# Patient Record
Sex: Female | Born: 1965 | Race: Black or African American | Hispanic: No | Marital: Single | Smoking: Current every day smoker
Health system: Southern US, Community
[De-identification: ages and names within clinical notes are randomized; demographics above are authoritative.]

## PROBLEM LIST (undated history)

## (undated) DIAGNOSIS — I10 Essential (primary) hypertension: Secondary | ICD-10-CM

## (undated) DIAGNOSIS — I639 Cerebral infarction, unspecified: Secondary | ICD-10-CM

---

## 2010-01-25 ENCOUNTER — Emergency Department (HOSPITAL_BASED_OUTPATIENT_CLINIC_OR_DEPARTMENT_OTHER)
Admission: EM | Admit: 2010-01-25 | Discharge: 2010-01-25 | Payer: Self-pay | Source: Home / Self Care | Admitting: Emergency Medicine

## 2010-04-22 LAB — BASIC METABOLIC PANEL
BUN: 12 mg/dL (ref 6–23)
Chloride: 109 mEq/L (ref 96–112)
Creatinine, Ser: 0.6 mg/dL (ref 0.4–1.2)
GFR calc Af Amer: >60 mL/min (ref >60)
GFR calc non Af Amer: >60 mL/min (ref >60)
Glucose, Bld: 85 mg/dL (ref 70–99)
Sodium: 143 mEq/L (ref 135–145)

## 2010-04-22 LAB — DIFFERENTIAL
Basophils Absolute: 0.1 10*3/uL (ref 0.0–0.1)
Basophils Relative: 1 % (ref 0–1)
Eosinophils Absolute: 0.1 10*3/uL (ref 0.0–0.7)
Lymphs Abs: 3.1 10*3/uL (ref 0.7–4.0)
Monocytes Absolute: 0.5 10*3/uL (ref 0.1–1.0)
Monocytes Relative: 6 % (ref 3–12)
Neutro Abs: 4.3 10*3/uL (ref 1.7–7.7)

## 2010-04-22 LAB — CBC
MCH: 29.3 pg (ref 26.0–34.0)
MCV: 86.1 fL (ref 78.0–100.0)
RBC: 4.54 MIL/uL (ref 3.87–5.11)
WBC: 8 10*3/uL (ref 4.0–10.5)

## 2010-04-22 LAB — PROTIME-INR: Prothrombin Time: 14.3 seconds (ref 11.6–15.2)

## 2019-02-20 ENCOUNTER — Other Ambulatory Visit: Payer: Self-pay

## 2019-02-20 ENCOUNTER — Emergency Department (HOSPITAL_BASED_OUTPATIENT_CLINIC_OR_DEPARTMENT_OTHER): Payer: Self-pay

## 2019-02-20 ENCOUNTER — Emergency Department (HOSPITAL_BASED_OUTPATIENT_CLINIC_OR_DEPARTMENT_OTHER)
Admission: EM | Admit: 2019-02-20 | Discharge: 2019-02-20 | Disposition: A | Discharge status: Home / Self Care | Payer: Self-pay | Attending: Emergency Medicine | Admitting: Emergency Medicine

## 2019-02-20 ENCOUNTER — Encounter (HOSPITAL_BASED_OUTPATIENT_CLINIC_OR_DEPARTMENT_OTHER): Payer: Self-pay

## 2019-02-20 DIAGNOSIS — Z23 Encounter for immunization: Secondary | ICD-10-CM | POA: Insufficient documentation

## 2019-02-20 DIAGNOSIS — Z8673 Personal history of transient ischemic attack (TIA), and cerebral infarction without residual deficits: Secondary | ICD-10-CM | POA: Insufficient documentation

## 2019-02-20 DIAGNOSIS — Z79899 Other long term (current) drug therapy: Secondary | ICD-10-CM | POA: Insufficient documentation

## 2019-02-20 DIAGNOSIS — S0003XA Contusion of scalp, initial encounter: Secondary | ICD-10-CM

## 2019-02-20 DIAGNOSIS — W01198A Fall on same level from slipping, tripping and stumbling with subsequent striking against other object, initial encounter: Secondary | ICD-10-CM | POA: Insufficient documentation

## 2019-02-20 DIAGNOSIS — Y998 Other external cause status: Secondary | ICD-10-CM | POA: Insufficient documentation

## 2019-02-20 DIAGNOSIS — S0101XA Laceration without foreign body of scalp, initial encounter: Secondary | ICD-10-CM

## 2019-02-20 DIAGNOSIS — E876 Hypokalemia: Secondary | ICD-10-CM

## 2019-02-20 DIAGNOSIS — Y92018 Other place in single-family (private) house as the place of occurrence of the external cause: Secondary | ICD-10-CM | POA: Insufficient documentation

## 2019-02-20 DIAGNOSIS — I10 Essential (primary) hypertension: Secondary | ICD-10-CM | POA: Insufficient documentation

## 2019-02-20 DIAGNOSIS — Y9389 Activity, other specified: Secondary | ICD-10-CM | POA: Insufficient documentation

## 2019-02-20 DIAGNOSIS — W19XXXA Unspecified fall, initial encounter: Secondary | ICD-10-CM

## 2019-02-20 HISTORY — DX: Cerebral infarction, unspecified: I63.9

## 2019-02-20 HISTORY — DX: Essential (primary) hypertension: I10

## 2019-02-20 LAB — URINALYSIS, ROUTINE W REFLEX MICROSCOPIC
Bilirubin Urine: NEGATIVE
Glucose, UA: NEGATIVE mg/dL
Ketones, ur: NEGATIVE mg/dL
Leukocytes,Ua: NEGATIVE
Nitrite: NEGATIVE
Protein, ur: NEGATIVE mg/dL
Specific Gravity, Urine: 1.025 (ref 1.005–1.030)
pH: 5.5 (ref 5.0–8.0)

## 2019-02-20 LAB — CBC
HCT: 33.2 % — ABNORMAL LOW (ref 36.0–46.0)
Hemoglobin: 11.1 g/dL — ABNORMAL LOW (ref 12.0–15.0)
MCH: 29.3 pg (ref 26.0–34.0)
MCHC: 33.4 g/dL (ref 30.0–36.0)
MCV: 87.6 fL (ref 80.0–100.0)
Platelets: 371 10*3/uL (ref 150–400)
RBC: 3.79 MIL/uL — ABNORMAL LOW (ref 3.87–5.11)
RDW: 17 % — ABNORMAL HIGH (ref 11.5–15.5)
WBC: 13.9 10*3/uL — ABNORMAL HIGH (ref 4.0–10.5)
nRBC: 0 % (ref 0.0–0.2)

## 2019-02-20 LAB — BASIC METABOLIC PANEL
Anion gap: 14 (ref 5–15)
BUN: 12 mg/dL (ref 6–20)
CO2: 22 mmol/L (ref 22–32)
Calcium: 8.4 mg/dL — ABNORMAL LOW (ref 8.9–10.3)
Chloride: 105 mmol/L (ref 98–111)
Creatinine, Ser: 0.79 mg/dL (ref 0.44–1.00)
GFR calc Af Amer: >60 mL/min (ref >60)
GFR calc non Af Amer: >60 mL/min (ref >60)
Glucose, Bld: 108 mg/dL — ABNORMAL HIGH (ref 70–99)
Potassium: 2.9 mmol/L — ABNORMAL LOW (ref 3.5–5.1)
Sodium: 141 mmol/L (ref 135–145)

## 2019-02-20 LAB — URINALYSIS, MICROSCOPIC (REFLEX)

## 2019-02-20 MED ORDER — ACETAMINOPHEN 500 MG PO TABS
1000.0000 mg | ORAL_TABLET | Freq: Once | ORAL | Status: AC
  Administered 2019-02-20: 17:00:00 1000 mg via ORAL
  Filled 2019-02-20: qty 2

## 2019-02-20 MED ORDER — CEPHALEXIN 500 MG PO CAPS: 500.0000 mg | ORAL_CAPSULE | Freq: Three times a day (TID) | ORAL | 0 refills | Status: AC

## 2019-02-20 MED ORDER — POTASSIUM CHLORIDE CRYS ER 20 MEQ PO TBCR
40.0000 meq | EXTENDED_RELEASE_TABLET | Freq: Once | ORAL | Status: AC
  Administered 2019-02-20: 18:00:00 40 meq via ORAL
  Filled 2019-02-20: qty 2

## 2019-02-20 MED ORDER — POTASSIUM CHLORIDE CRYS ER 20 MEQ PO TBCR: 20.0000 meq | EXTENDED_RELEASE_TABLET | Freq: Every day | ORAL | 0 refills | Status: AC

## 2019-02-20 MED ORDER — TETANUS-DIPHTH-ACELL PERTUSSIS 5-2.5-18.5 LF-MCG/0.5 IM SUSP
0.5000 mL | Freq: Once | INTRAMUSCULAR | Status: AC
  Administered 2019-02-20: 17:00:00 0.5 mL via INTRAMUSCULAR
  Filled 2019-02-20: qty 0.5

## 2019-02-20 MED ORDER — LIDOCAINE-EPINEPHRINE (PF) 2 %-1:200000 IJ SOLN
INTRAMUSCULAR | Status: AC
  Filled 2019-02-20: qty 10

## 2019-02-20 MED ORDER — LIDOCAINE-EPINEPHRINE (PF) 2 %-1:200000 IJ SOLN
10.0000 mL | Freq: Once | INTRAMUSCULAR | Status: AC
  Administered 2019-02-20: 10 mL
  Filled 2019-02-20: qty 10

## 2019-02-20 MED ORDER — CEPHALEXIN 250 MG PO CAPS
1000.0000 mg | ORAL_CAPSULE | Freq: Once | ORAL | Status: AC
  Administered 2019-02-20: 1000 mg via ORAL
  Filled 2019-02-20: qty 4

## 2019-02-20 NOTE — ED Notes (Signed)
Went to get pt for imaging; Dr. Denton Lank in room; RN to call when pt is ready

## 2019-02-20 NOTE — ED Notes (Signed)
ED Provider at bedside. 

## 2019-02-20 NOTE — ED Notes (Addendum)
Pt given crackers and gingerale. Made aware of need for urine specimen

## 2019-02-20 NOTE — ED Notes (Signed)
ED Provider at bedside. Dr. Denton Lank for suturing

## 2019-02-20 NOTE — ED Triage Notes (Signed)
Pt arrives with c/o fall last night, pt is tearful in triage, and has dried blood in hair.

## 2019-02-20 NOTE — ED Notes (Signed)
Pt has spoken to her sister by phone

## 2019-02-20 NOTE — ED Provider Notes (Signed)
[facility] EMERGENCY DEPARTMENT Provider Note   CSN: [id number] Arrival date & time: 02/20/19  1608     History Chief Complaint  Patient presents with  . Fall    Shannon Norton is a 54 y.o. female.  Patient s/p fall at home last evening. Pt unsure what caused fall, hit posterior head/scalp, laceration to area. No other recent falls or syncope. Denies current or recent chest pain or discomfort. No palpitations or sense of rapid or irregular heartbeat. No recent blood loss, rectal bleeding or melena. Denies other pain or injury. Dull pain to laceration area/scalp. No severe headache. No neck or back pain. No extremity pain or injury. No chest pain or sob. No abd pain or nvd. No gu c/o. States is on blood thinner therapy but unsure which one. Tetanus unknown.   The history is provided by the patient.  Fall Pertinent negatives include no chest pain, no abdominal pain and no shortness of breath.       Past Medical History:  Diagnosis Date  . Hypertension   . Stroke [facility])     There are no problems to display for this patient.   History reviewed. No pertinent surgical history.   OB History   No obstetric history on file.     No family history on file.  Social History   Tobacco Use  . Smoking status: Not on file  . Smokeless tobacco: Never Used  Substance Use Topics  . Alcohol use: Never  . Drug use: Never    Home Medications Prior to Admission medications   Medication Sig Start Date End Date Taking? Authorizing Provider  amLODipine (NORVASC) 10 MG tablet Take by mouth. 01/16/17  Yes [provider]  atorvastatin (LIPITOR) 40 MG tablet Take by mouth. 02/17/17  Yes [provider]  clopidogrel (PLAVIX) 75 MG tablet Take by mouth. 02/17/17  Yes [provider]  lisinopril (ZESTRIL) 10 MG tablet Take by mouth. 12/09/16  Yes [provider]  metFORMIN (GLUCOPHAGE) 1000 MG tablet Take 1,000 mg by mouth 2 (two) times daily.  02/14/19   [provider]    Allergies    Patient has no known allergies.  Review of Systems   Review of Systems  Constitutional: Negative for chills and fever.  HENT: Negative for nosebleeds.   Eyes: Negative for pain and visual disturbance.  Respiratory: Negative for shortness of breath.   Cardiovascular: Negative for chest pain.  Gastrointestinal: Negative for abdominal pain, blood in stool, diarrhea and vomiting.  Genitourinary: Negative for dysuria and flank pain.  Musculoskeletal: Negative for back pain and neck pain.  Skin: Positive for wound.  Neurological: Negative for weakness and numbness.  Hematological:       +anticoag therapy.   Psychiatric/Behavioral: Negative for confusion.    Physical Exam Updated Vital Signs BP 135/84 (BP Location: Right Arm)   Pulse (!) 110   Temp 98.1 F (36.7 C) (Oral)   Resp 18   Ht 1.499 m (4\' 11" )   Wt 74.8 kg   SpO2 100%   BMI 33.33 kg/m   Physical Exam Vitals and nursing note reviewed.  Constitutional:      Appearance: Normal appearance. She is well-developed.  HENT:     Head:     Comments: Contusion/laceration to posterior scalp. Laceration 5 cm.     Nose: Nose normal.     Mouth/Throat:     Mouth: Mucous membranes are moist.  Eyes:     General: No scleral icterus.  Conjunctiva/sclera: Conjunctivae normal.     Pupils: Pupils are equal, round, and reactive to light.  Neck:     Vascular: No carotid bruit.     Trachea: No tracheal deviation.  Cardiovascular:     Rate and Rhythm: Normal rate and regular rhythm.     Pulses: Normal pulses.     Heart sounds: Normal heart sounds. No murmur. No friction rub. No gallop.   Pulmonary:     Effort: Pulmonary effort is normal. No respiratory distress.     Breath sounds: Normal breath sounds.  Chest:     Chest wall: No tenderness.  Abdominal:     General: Bowel sounds are normal. There is no distension.     Palpations: Abdomen is soft.     Tenderness: There is no  abdominal tenderness. There is no guarding.  Genitourinary:    Comments: No cva tenderness.  Musculoskeletal:        General: No swelling or tenderness.     Cervical back: Normal range of motion and neck supple. No rigidity or tenderness. No muscular tenderness.     Comments: CTLS spine, non tender, aligned, no step off. Good rom bil extremities without pain or focal bony tenderness.   Skin:    General: Skin is warm and dry.     Findings: No rash.  Neurological:     Mental Status: She is alert.     Comments: Alert, speech normal. GCS 15. Motor/sens grossly intact bil. Steady gait.   Psychiatric:        Mood and Affect: Mood normal.     ED Results / Procedures / Treatments   Labs (all labs ordered are listed, but only abnormal results are displayed) Results for orders placed or performed during the hospital encounter of 02/20/19  CBC  Result Value Ref Range   WBC 13.9 (H) 4.0 - 10.5 K/uL   RBC 3.79 (L) 3.87 - 5.11 MIL/uL   Hemoglobin 11.1 (L) 12.0 - 15.0 g/dL   HCT 33.2 (L) 36.0 - 46.0 %   MCV 87.6 80.0 - 100.0 fL   MCH 29.3 26.0 - 34.0 pg   MCHC 33.4 30.0 - 36.0 g/dL   RDW 17.0 (H) 11.5 - 15.5 %   Platelets 371 150 - 400 K/uL   nRBC 0.0 0.0 - 0.2 %  BMET  Result Value Ref Range   Sodium 141 135 - 145 mmol/L   Potassium 2.9 (L) 3.5 - 5.1 mmol/L   Chloride 105 98 - 111 mmol/L   CO2 22 22 - 32 mmol/L   Glucose, Bld 108 (H) 70 - 99 mg/dL   BUN 12 6 - 20 mg/dL   Creatinine, Ser 0.79 0.44 - 1.00 mg/dL   Calcium 8.4 (L) 8.9 - 10.3 mg/dL   GFR calc non Af Amer >60 >60 mL/min   GFR calc Af Amer >60 >60 mL/min   Anion gap 14 5 - 15  UA  Result Value Ref Range   Color, Urine YELLOW YELLOW   APPearance CLEAR CLEAR   Specific Gravity, Urine 1.025 1.005 - 1.030   pH 5.5 5.0 - 8.0   Glucose, UA NEGATIVE NEGATIVE mg/dL   Hgb urine dipstick TRACE (A) NEGATIVE   Bilirubin Urine NEGATIVE NEGATIVE   Ketones, ur NEGATIVE NEGATIVE mg/dL   Protein, ur NEGATIVE NEGATIVE mg/dL    Nitrite NEGATIVE NEGATIVE   Leukocytes,Ua NEGATIVE NEGATIVE  Urinalysis, Microscopic (reflex)  Result Value Ref Range   RBC / HPF 0-5 0 - 5 RBC/hpf  WBC, UA 0-5 0 - 5 WBC/hpf   Bacteria, UA FEW (A) NONE SEEN   Squamous Epithelial / LPF 6-10 0 - 5   Mucus PRESENT    Hyaline Casts, UA PRESENT    CT HEAD WO CONTRAST  Result Date: 02/20/2019 CLINICAL DATA:  Fall, head strike EXAM: CT HEAD WITHOUT CONTRAST TECHNIQUE: Contiguous axial images were obtained from the base of the skull through the vertex without intravenous contrast. COMPARISON:  2018 FINDINGS: Brain: There is no acute intracranial hemorrhage, mass-effect, or edema. Gray-white differentiation is preserved. There is no extra-axial fluid collection. Prominence of the ventricles sulci reflects parenchymal volume loss. This has mildly progressed since 2018. Patchy hypoattenuation in the supratentorial white matter is nonspecific but probably reflects mild to moderate chronic microvascular ischemic changes. Vascular: There is mild atherosclerotic calcification at the skull base. Skull: Calvarium is intact. Sinuses/Orbits: Focal right anterior ethmoid lobular mucosal thickening. Otherwise trace paranasal sinus mucosal thickening. No significant orbital finding. Other: Right posterior scalp soft tissue swelling/laceration. IMPRESSION: No evidence of acute intracranial injury. Electronically Signed   By: Guadlupe Spanish M.D.   On: 02/20/2019 17:39    EKG EKG Interpretation  Date/Time:  Sunday February 20 2019 17:42:26 EST Ventricular Rate:  82 PR Interval:    QRS Duration: 100 QT Interval:  398 QTC Calculation: 465 R Axis:   64 Text Interpretation: Sinus rhythm Nonspecific T wave abnormality Confirmed by Cathren Laine ([id number]) on 02/20/2019 7:19:37 PM   Radiology CT HEAD WO CONTRAST  Result Date: 02/20/2019 CLINICAL DATA:  Fall, head strike EXAM: CT HEAD WITHOUT CONTRAST TECHNIQUE: Contiguous axial images were obtained from the base of  the skull through the vertex without intravenous contrast. COMPARISON:  2018 FINDINGS: Brain: There is no acute intracranial hemorrhage, mass-effect, or edema. Gray-white differentiation is preserved. There is no extra-axial fluid collection. Prominence of the ventricles sulci reflects parenchymal volume loss. This has mildly progressed since 2018. Patchy hypoattenuation in the supratentorial white matter is nonspecific but probably reflects mild to moderate chronic microvascular ischemic changes. Vascular: There is mild atherosclerotic calcification at the skull base. Skull: Calvarium is intact. Sinuses/Orbits: Focal right anterior ethmoid lobular mucosal thickening. Otherwise trace paranasal sinus mucosal thickening. No significant orbital finding. Other: Right posterior scalp soft tissue swelling/laceration. IMPRESSION: No evidence of acute intracranial injury. Electronically Signed   By: Guadlupe Spanish M.D.   On: 02/20/2019 17:39    Procedures .Marland KitchenLaceration Repair  Date/Time: 02/20/2019 5:08 PM Performed by: Cathren Laine, MD Authorized by: Cathren Laine, MD   Consent:    Consent given by:  Patient Anesthesia (see MAR for exact dosages):    Anesthesia method:  Local infiltration   Local anesthetic:  Lidocaine 2% WITH epi Laceration details:    Location:  Scalp   Scalp location: right posterior.   Length (cm):  5 Repair type:    Repair type:  Intermediate Pre-procedure details:    Preparation:  Patient was prepped and draped in usual sterile fashion Exploration:    Wound exploration: entire depth of wound probed and visualized     Contaminated: no   Treatment:    Area cleansed with:  Saline   Amount of cleaning:  Extensive   Irrigation solution:  Sterile saline   Irrigation method:  Syringe Subcutaneous repair:    Suture size:  4-0   Suture material:  Vicryl   Number of sutures:  3 Skin repair:    Repair method:  Sutures   Suture size:  4-0   Suture material:  Prolene   Number  of sutures:  10 Post-procedure details:    Dressing:  Antibiotic ointment   Patient tolerance of procedure:  Tolerated well, no immediate complications   (including critical care time)  Medications Ordered in ED Medications  lidocaine-EPINEPHrine (XYLOCAINE W/EPI) 2 %-1:200000 (PF) injection 10 mL (has no administration in time range)  lidocaine-EPINEPHrine (XYLOCAINE W/EPI) 2 %-1:200000 (PF) injection (has no administration in time range)    ED Course  I have reviewed the triage vital signs and the nursing notes.  Pertinent labs & imaging results that were available during my care of the patient were reviewed by me and considered in my medical decision making (see chart for details).    MDM Rules/Calculators/A&P                      Labs and imaging.  Reviewed nursing notes and prior charts for additional history.   Tetanus IM.  Wound repaired, bacitracin applied.   As wound/injury last evening, multiple hours ago, will give dose keflex in ED, rx for home for next few days.   Acetaminophen po.   Recheck, pt alert, content, no distress.  Labs reviewed/interpreted by me - k low, 2.9. kcl po.   CT reviewed/interpreted by me - no hem.  Patient currently appears stable for d/c.  Rec pcp f/u.  Return precautions provided.      Final Clinical Impression(s) / ED Diagnoses Final diagnoses:  None    Rx / DC Orders ED Discharge Orders    None       Cathren Laine, MD 02/20/19 1921

## 2019-02-20 NOTE — Discharge Instructions (Addendum)
It was our pleasure to provide your ER care today - we hope that you feel better.  Keep area of wound very clean/dry.  Have sutures removed, your doctor or urgent care, in 7-10 days.   Take antibiotic as prescribed. Take acetaminophen as need.   From today's lab tests, your potassium level is low (2.9) - eat plenty of fruits and vegetables, take potassium supplement as prescribed, and follow up with primary care doctor in 1 week.  Follow up with your doctor in 1 week.  Return to ER if worse, new symptoms, fevers, infection of wound (severe pain, pus, spreading redness), severe headache, new or severe pain, weak/fainting, or other concern.

## 2019-03-03 ENCOUNTER — Other Ambulatory Visit: Payer: Self-pay

## 2019-03-03 ENCOUNTER — Emergency Department (HOSPITAL_BASED_OUTPATIENT_CLINIC_OR_DEPARTMENT_OTHER)
Admission: EM | Admit: 2019-03-03 | Discharge: 2019-03-03 | Disposition: A | Discharge status: Home / Self Care | Payer: Self-pay | Attending: Emergency Medicine | Admitting: Emergency Medicine

## 2019-03-03 ENCOUNTER — Encounter (HOSPITAL_BASED_OUTPATIENT_CLINIC_OR_DEPARTMENT_OTHER): Payer: Self-pay | Admitting: *Deleted

## 2019-03-03 DIAGNOSIS — I10 Essential (primary) hypertension: Secondary | ICD-10-CM | POA: Insufficient documentation

## 2019-03-03 DIAGNOSIS — Z79899 Other long term (current) drug therapy: Secondary | ICD-10-CM | POA: Insufficient documentation

## 2019-03-03 DIAGNOSIS — F17210 Nicotine dependence, cigarettes, uncomplicated: Secondary | ICD-10-CM | POA: Insufficient documentation

## 2019-03-03 DIAGNOSIS — Z8673 Personal history of transient ischemic attack (TIA), and cerebral infarction without residual deficits: Secondary | ICD-10-CM | POA: Insufficient documentation

## 2019-03-03 DIAGNOSIS — Z7901 Long term (current) use of anticoagulants: Secondary | ICD-10-CM | POA: Insufficient documentation

## 2019-03-03 DIAGNOSIS — Z4802 Encounter for removal of sutures: Secondary | ICD-10-CM | POA: Insufficient documentation

## 2019-03-03 NOTE — Discharge Instructions (Signed)
Continue to keep the area clean and dry. Can apply shampoo to the area. Can take ibuprofen or Tylenol as needed for pain. Return to the emergency department immediately if any concerning signs or symptoms develop such as fevers, abnormal drainage or severe swelling.

## 2019-03-03 NOTE — ED Provider Notes (Signed)
[facility] EMERGENCY DEPARTMENT Provider Note   CSN: [id number] Arrival date & time: 03/03/19  1550     History Chief Complaint  Patient presents with  . Suture / Staple Removal    Shannon Norton is a 54 y.o. female with history of hypertension and stroke presents today for evaluation of suture removal. She was seen and evaluated in the ED on 02/20/2019 the day after sustaining a head injury. Underwent suture removal of scalp laceration. Reports that she has had no pain around the site, denies drainage or fevers. Has not had any significant headaches. Reports that she overall is feeling quite well.  The history is provided by the patient.       Past Medical History:  Diagnosis Date  . Hypertension   . Stroke [facility])     There are no problems to display for this patient.   History reviewed. No pertinent surgical history.   OB History   No obstetric history on file.     No family history on file.  Social History   Tobacco Use  . Smoking status: Current Every Day Smoker  . Smokeless tobacco: Never Used  Substance Use Topics  . Alcohol use: Never  . Drug use: Never    Home Medications Prior to Admission medications   Medication Sig Start Date End Date Taking? Authorizing Provider  amLODipine (NORVASC) 10 MG tablet Take by mouth. 01/16/17   [provider]  atorvastatin (LIPITOR) 40 MG tablet Take by mouth. 02/17/17   [provider]  cephALEXin (KEFLEX) 500 MG capsule Take 1 capsule (500 mg total) by mouth 3 (three) times daily. 02/20/19   Lajean Saver, MD  clopidogrel (PLAVIX) 75 MG tablet Take by mouth. 02/17/17   [provider]  lisinopril (ZESTRIL) 10 MG tablet Take by mouth. 12/09/16   [provider]  metFORMIN (GLUCOPHAGE) 1000 MG tablet Take 1,000 mg by mouth 2 (two) times daily. 02/14/19   [provider]  potassium chloride SA (KLOR-CON) 20 MEQ tablet Take 1 tablet (20 mEq total) by mouth daily. 02/20/19    Lajean Saver, MD    Allergies    Patient has no known allergies.  Review of Systems   Review of Systems  Constitutional: Negative for chills and fever.  Skin: Positive for wound. Negative for color change.  Neurological: Negative for headaches.    Physical Exam Updated Vital Signs BP 100/68   Pulse 98   Temp 98.8 F (37.1 C) (Oral)   Resp 16   Ht 4\' 11"  (1.499 m)   Wt 71.2 kg   SpO2 100%   BMI 31.71 kg/m   Physical Exam Vitals and nursing note reviewed.  Constitutional:      General: She is not in acute distress.    Appearance: She is well-developed.     Comments: Resting comfortably in chair  HENT:     Head: Normocephalic and atraumatic.     Comments: Well-healed scalp laceration to the posterior scalp with 9 simple interrupted sutures externally. No significant swelling, erythema, induration, or drainage. No tenderness to palpation. Eyes:     General:        Right eye: No discharge.        Left eye: No discharge.     Conjunctiva/sclera: Conjunctivae normal.  Neck:     Vascular: No JVD.     Trachea: No tracheal deviation.  Cardiovascular:     Rate and Rhythm: Normal rate.  Pulmonary:     Effort:  Pulmonary effort is normal.  Abdominal:     General: There is no distension.  Skin:    General: Skin is warm and dry.     Findings: No erythema.  Neurological:     Mental Status: She is alert.  Psychiatric:        Behavior: Behavior normal.     ED Results / Procedures / Treatments   Labs (all labs ordered are listed, but only abnormal results are displayed) Labs Reviewed - No data to display  EKG None  Radiology No results found.  Procedures .Suture Removal  Date/Time: 03/03/2019 4:18 PM Performed by: Jeanie Sewer, PA-C Authorized by: Jeanie Sewer, PA-C   Consent:    Consent obtained:  Verbal   Consent given by:  Patient   Risks discussed:  Bleeding, pain and wound separation Location:    Location:  Head/neck   Head/neck location:   Scalp Procedure details:    Wound appearance:  No signs of infection, good wound healing, clean, nonpurulent and nontender   Number of sutures removed:  9 Post-procedure details:    Post-removal:  No dressing applied   Patient tolerance of procedure:  Tolerated well, no immediate complications   (including critical care time)  Medications Ordered in ED Medications - No data to display  ED Course  I have reviewed the triage vital signs and the nursing notes.  Pertinent labs & imaging results that were available during my care of the patient were reviewed by me and considered in my medical decision making (see chart for details).    MDM Rules/Calculators/A&P                      Patient presenting for evaluation of suture removal. She is afebrile, initially mildly tachycardic on presentation but on reevaluation heart rate is within normal limits. She is overall quite well-appearing. No signs of secondary skin infection or wound dehiscence. Sutures were removed which patient tolerated without difficulty. Discussed wound care, scar minimization. Discussed strict ED return precautions. Paitent verbalized understanding of and agreement with plan and is safe for discharge home at this time.  Final Clinical Impression(s) / ED Diagnoses Final diagnoses:  Visit for suture removal    Rx / DC Orders ED Discharge Orders    None       Bennye Alm 03/03/19 1622    Raeford Razor, MD 03/03/19 2317

## 2019-03-03 NOTE — ED Triage Notes (Signed)
Suture removal from the back of her head.

## 2020-08-03 IMAGING — CT CT HEAD W/O CM
3 series · 16 of 47 positions shown, 19 images · non-contrast
Comparison: [id number]

CLINICAL DATA: Fall, head strike

EXAM:
CT HEAD WITHOUT CONTRAST
TECHNIQUE: Contiguous axial images were obtained from the base of the skull
through the vertex without intravenous contrast.

[Series 2: head wo · axial · 0.46mm/px · z∈[-194,-54]mm · 10 of 34 slices shown, 13 images]
[im 3/34  brain]
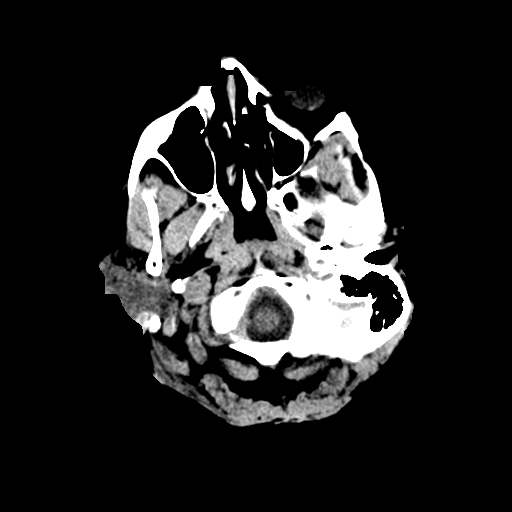
[im 3/34  bone]
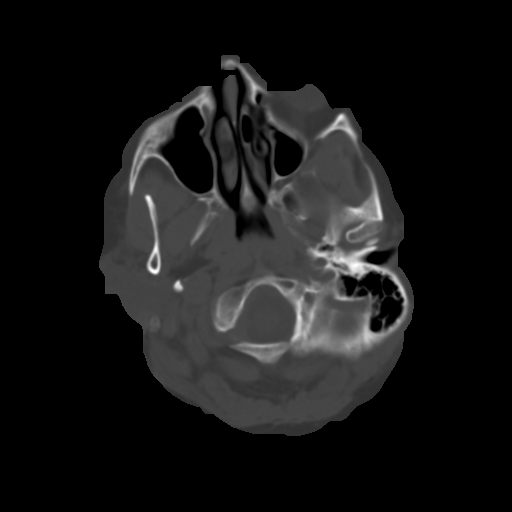
[im 6/34  brain]
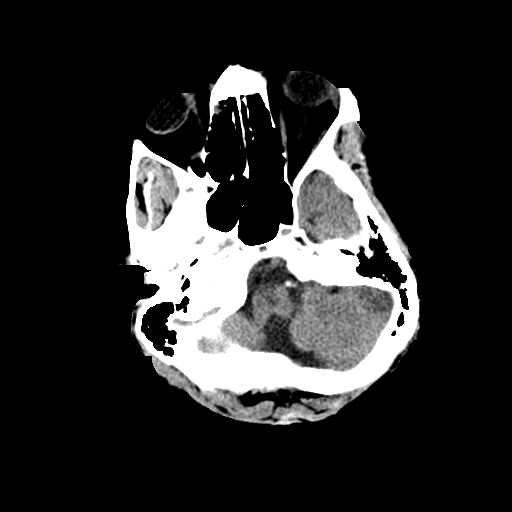
[im 10/34  brain]
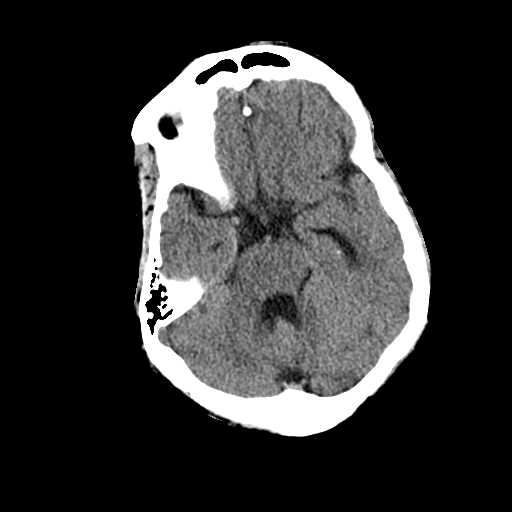
[im 12/34  brain]
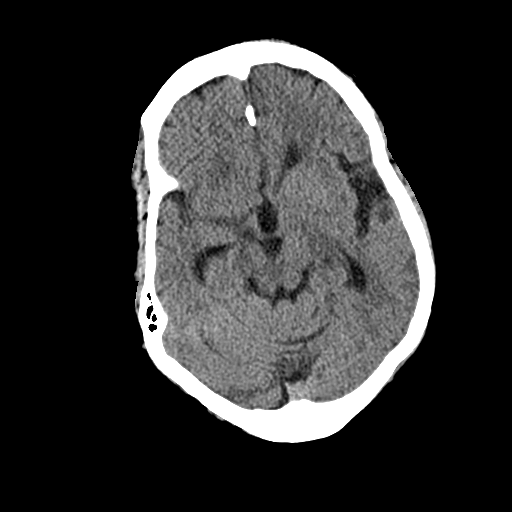
[im 15/34  brain]
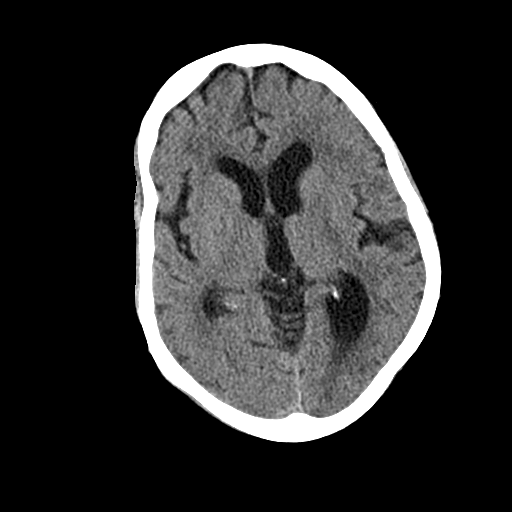
[im 15/34  bone]
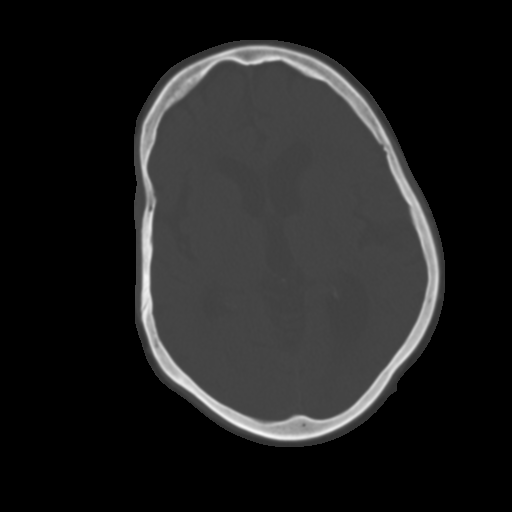
[im 19/34  brain]
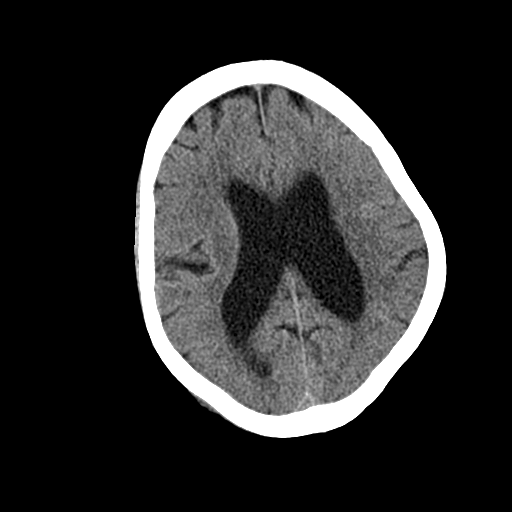
[im 22/34  brain]
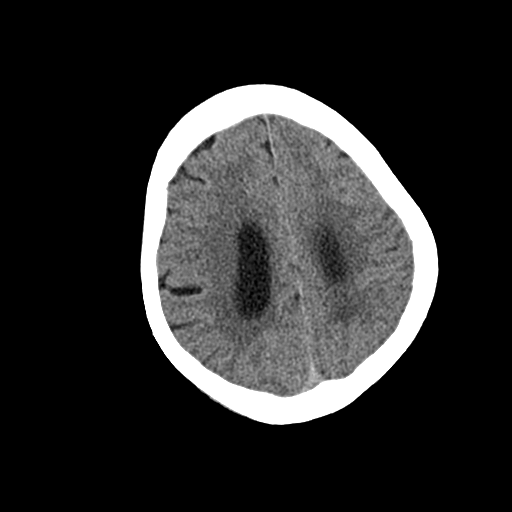
[im 26/34  brain]
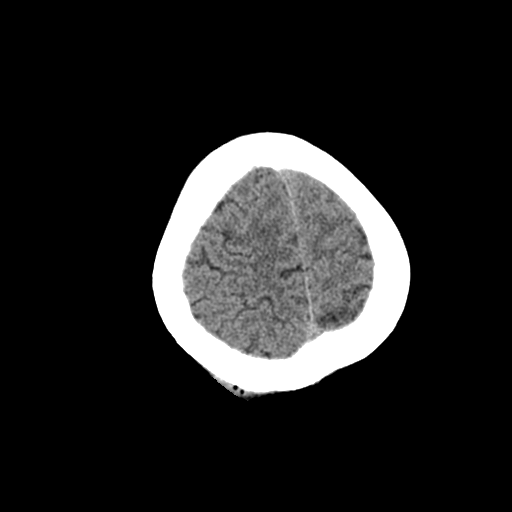
[im 28/34  brain]
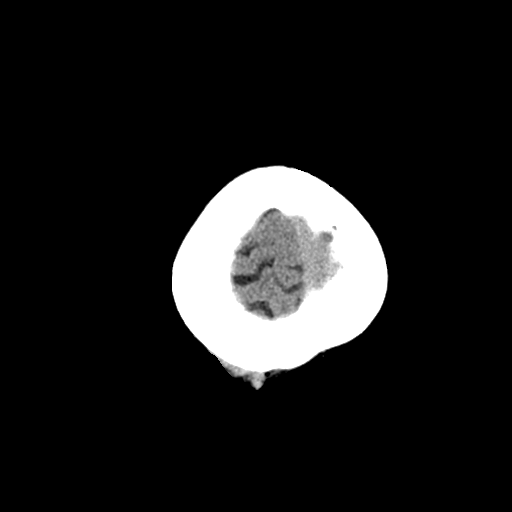
[im 28/34  bone]
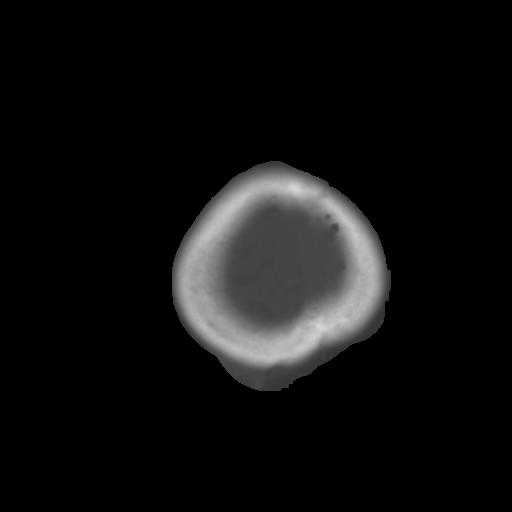
[im 31/34  brain]
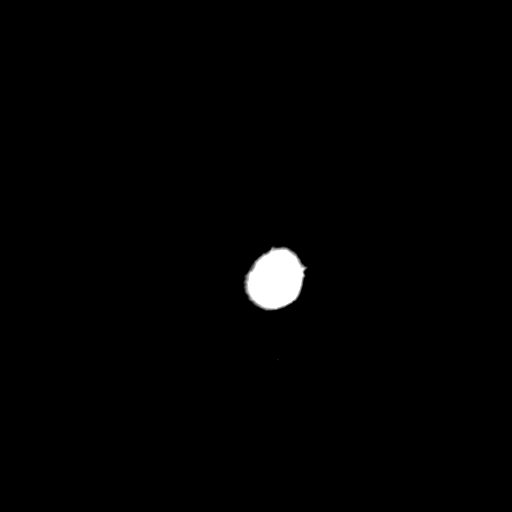

[Series 4: cor soft · coronal · 0.36mm/px · 3 of 69 slices shown]
[im 23/69  brain]
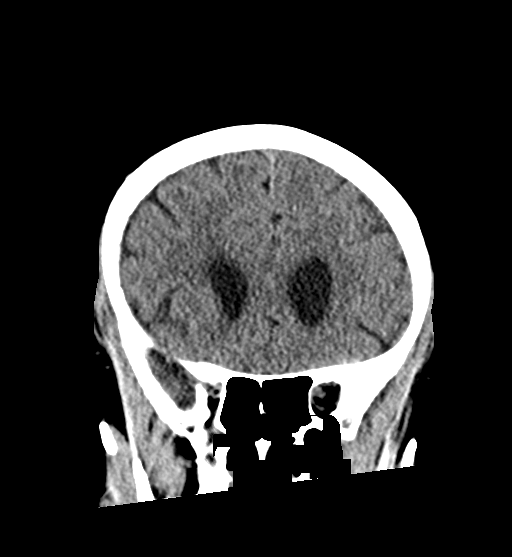
[im 31/69  brain]
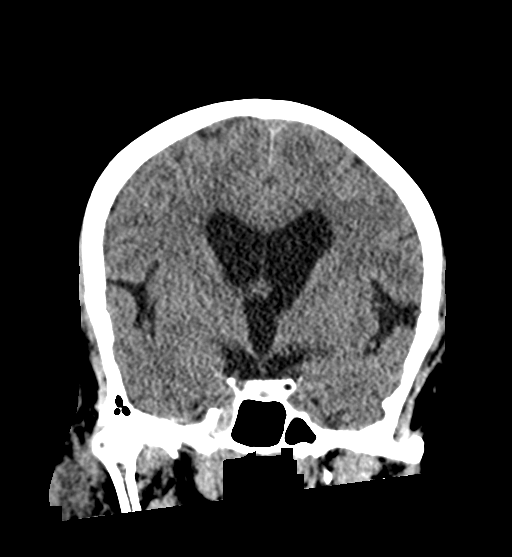
[im 38/69  brain]
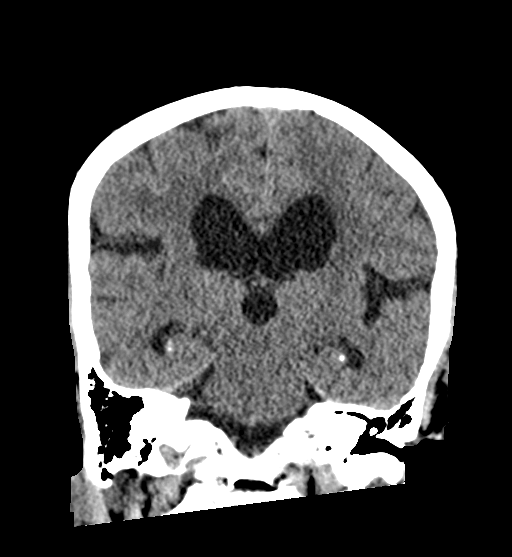

[Series 5: sag soft · sagittal · 0.39mm/px · 3 of 55 slices shown]
[im 19/55  brain]
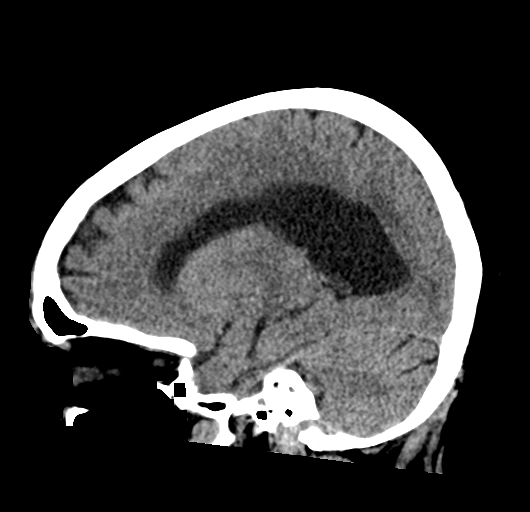
[im 28/55  brain]
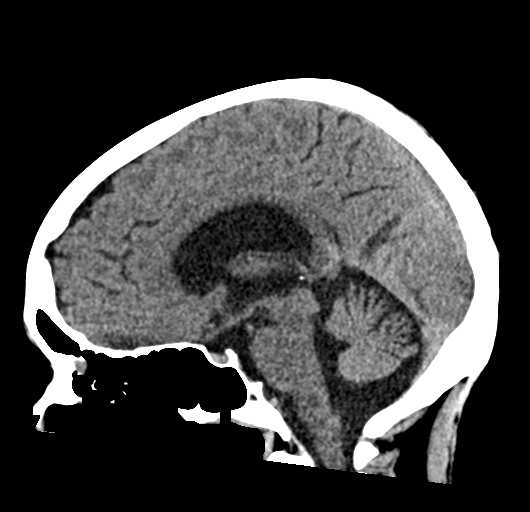
[im 36/55  brain]
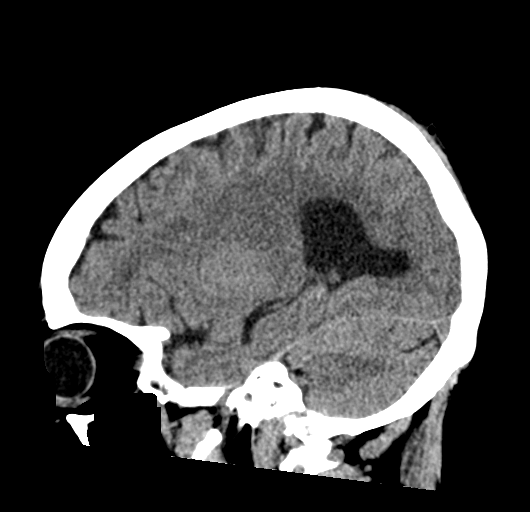

[16 of 47 positions shown; findings below may reference images not displayed]

FINDINGS: Brain: There is no acute intracranial hemorrhage, mass-effect, or
edema. Gray-white differentiation is preserved. There is no
extra-axial fluid collection. Prominence of the ventricles sulci
reflects parenchymal volume loss. This has mildly progressed since
6843. Patchy hypoattenuation in the supratentorial white matter is
nonspecific but probably reflects mild to moderate chronic
microvascular ischemic changes.

Vascular: There is mild atherosclerotic calcification at the skull
base.

Skull: Calvarium is intact.

Sinuses/Orbits: Focal right anterior ethmoid lobular mucosal
thickening. Otherwise trace paranasal sinus mucosal thickening. No
significant orbital finding.

Other: Right posterior scalp soft tissue swelling/laceration.
IMPRESSION: No evidence of acute intracranial injury.

## 2023-08-19 ENCOUNTER — Other Ambulatory Visit: Payer: Self-pay
